# Patient Record
Sex: Male | Born: 1970 | Hispanic: No | Marital: Single | Smoking: Current every day smoker
Health system: Southern US, Community
[De-identification: ages and names within clinical notes are randomized; demographics above are authoritative.]

## PROBLEM LIST (undated history)

## (undated) DIAGNOSIS — B1910 Unspecified viral hepatitis B without hepatic coma: Secondary | ICD-10-CM

## (undated) DIAGNOSIS — A159 Respiratory tuberculosis unspecified: Secondary | ICD-10-CM

## (undated) DIAGNOSIS — K219 Gastro-esophageal reflux disease without esophagitis: Secondary | ICD-10-CM

---

## 2011-08-23 ENCOUNTER — Encounter: Payer: Self-pay | Admitting: Emergency Medicine

## 2011-08-23 ENCOUNTER — Emergency Department (HOSPITAL_COMMUNITY)
Admission: EM | Admit: 2011-08-23 | Discharge: 2011-08-23 | Disposition: A | Discharge status: Home / Self Care | Payer: Medicaid Other | Attending: Emergency Medicine | Admitting: Emergency Medicine

## 2011-08-23 DIAGNOSIS — L989 Disorder of the skin and subcutaneous tissue, unspecified: Secondary | ICD-10-CM | POA: Insufficient documentation

## 2011-08-23 DIAGNOSIS — L089 Local infection of the skin and subcutaneous tissue, unspecified: Secondary | ICD-10-CM

## 2011-08-23 MED ORDER — HYDROCODONE-ACETAMINOPHEN 7.5-325 MG/15ML PO SOLN: 15.0000 mL | Freq: Four times a day (QID) | ORAL | Status: DC | PRN

## 2011-08-23 MED ORDER — DOXYCYCLINE HYCLATE 100 MG PO CAPS: 100.0000 mg | ORAL_CAPSULE | Freq: Two times a day (BID) | ORAL | Status: AC

## 2011-08-23 NOTE — ED Provider Notes (Signed)
Medical screening examination/treatment/procedure(s) were performed by non-physician practitioner and as supervising physician I was immediately available for consultation/collaboration.  Flint Melter, MD 08/23/11 [phone]

## 2011-08-23 NOTE — ED Notes (Signed)
Pt states for 3 days has boil area to rt knee and lt hip area and rt fa, swelling and pain,

## 2011-08-23 NOTE — ED Notes (Signed)
Pt has multiple skin lesions and swollen tender areas on legs. 1+ pitting edema on r/calf

## 2011-08-23 NOTE — ED Provider Notes (Signed)
History     CSN: [id number] Arrival date & time: 08/23/2011  3:24 PM   First MD Initiated Contact with Patient 08/23/11 1640      Chief Complaint  Patient presents with  . Recurrent Skin Infections    HPI: Patient is a 40 y.o. male presenting with rash. The history is provided by the patient. The history is limited by a language barrier. A language interpreter was used.  Rash  This is a new problem. The current episode started more than 2 days ago. The problem has been gradually worsening. The problem is associated with nothing. There has been no fever. The rash is present on the right lower leg and left upper leg. The pain is at a severity of 4/10. The pain is mild. The pain has been constant since onset. Associated symptoms include pain. Pertinent negatives include no weeping.  Reports 3 day hx of painful swollen areas to his (R) knee, (R) shin and (L) latera lupper thigh.  History reviewed. No pertinent past medical history.  History reviewed. No pertinent past surgical history.  History reviewed. No pertinent family history.  History  Substance Use Topics  . Smoking status: Current Everyday Smoker  . Smokeless tobacco: Not on file  . Alcohol Use: Yes      Review of Systems  Constitutional: Negative.   HENT: Negative.   Eyes: Negative.   Respiratory: Negative.   Cardiovascular: Negative.   Gastrointestinal: Negative.   Genitourinary: Negative.   Musculoskeletal: Negative.   Skin: Positive for rash.  Neurological: Negative.   Hematological: Negative.   Psychiatric/Behavioral: Negative.     Allergies  Review of patient's allergies indicates no known allergies.  Home Medications  No current outpatient prescriptions on file.  There were no vitals taken for this visit.  Physical Exam  Constitutional: He is oriented to person, place, and time. He appears well-developed and well-nourished.  HENT:  Head: Normocephalic and atraumatic.  Eyes: Conjunctivae are  normal.  Cardiovascular: Normal rate.   Pulmonary/Chest: Effort normal.  Neurological: He is alert and oriented to person, place, and time.  Skin: Skin is warm and dry.          Slightly raised, erythematous, indurated lesions to (R) knee, (R) shin and (L) lateral upper thigh that area approx 2-3 cm in size w/o active drainage that are TTP.   Psychiatric: He has a normal mood and affect.    ED Course  ProceduresLabs Reviewed - No data to display No results found.    No diagnosis found.    MDM  Multiple painful, erythematous skin lesions worrisome for MRSA.       Leanne Chang, NP 08/23/11 1657  Roma Kayser Shaarav Ripple, NP 08/23/11 1659  Roma Kayser Amayiah Gosnell, NP 08/23/11 [id number]

## 2011-08-25 ENCOUNTER — Emergency Department (HOSPITAL_COMMUNITY)
Admission: EM | Admit: 2011-08-25 | Discharge: 2011-08-25 | Disposition: A | Discharge status: Home / Self Care | Payer: Medicaid Other | Source: Home / Self Care | Attending: Family Medicine | Admitting: Family Medicine

## 2011-08-25 ENCOUNTER — Encounter (HOSPITAL_COMMUNITY): Payer: Self-pay | Admitting: *Deleted

## 2011-08-25 DIAGNOSIS — L0390 Cellulitis, unspecified: Secondary | ICD-10-CM

## 2011-08-25 DIAGNOSIS — L0291 Cutaneous abscess, unspecified: Secondary | ICD-10-CM

## 2011-08-25 MED ORDER — SULFAMETHOXAZOLE-TRIMETHOPRIM 800-160 MG PO TABS: 1.0000 | ORAL_TABLET | Freq: Two times a day (BID) | ORAL | Status: AC

## 2011-08-25 NOTE — ED Notes (Signed)
Presents for f/u of skin infection; seen at [facility] ED & given doxycycline and hydrocodone susp on 11/23.  Reports no improvement, and c/o pitting edema to RLE.  Via [employer] # [id number], physician eval and instructions reviewed.  Pt verbalized understanding.

## 2011-08-25 NOTE — ED Provider Notes (Signed)
History     CSN: [id number] Arrival date & time: 08/25/2011 12:31 PM   First MD Initiated Contact with Patient 08/25/11 1115      Chief Complaint  Patient presents with  . Follow-up  . Leg Swelling    (Consider location/radiation/quality/duration/timing/severity/associated sxs/prior treatment) Patient is a 40 y.o. male presenting with rash. The history is provided by the patient. The history is limited by a language barrier.  Rash  This is a recurrent problem. The current episode started more than 2 days ago. The problem has not changed since onset.The problem is associated with an unknown (seen 11/23 in ER, given doxy and not improved,, here for re-eval.) factor. There has been no fever. The rash is present on the left buttock, face and right lower leg. The pain is at a severity of 4/10. The pain is mild. The pain has been constant since onset.    No past medical history on file.  No past surgical history on file.  No family history on file.  History  Substance Use Topics  . Smoking status: Current Everyday Smoker  . Smokeless tobacco: Not on file  . Alcohol Use: Yes      Review of Systems  Constitutional: Negative.   Skin: Positive for rash and wound.    Allergies  Review of patient's allergies indicates no known allergies.  Home Medications   Current Outpatient Rx  Name Route Sig Dispense Refill  . DOXYCYCLINE HYCLATE 100 MG PO CAPS Oral Take 1 capsule (100 mg total) by mouth 2 (two) times daily. 20 capsule 0  . HYDROCODONE-ACETAMINOPHEN 7.5-325 MG/15ML PO SOLN Oral Take 15 mLs by mouth 4 (four) times daily as needed for pain. 50 mL 0    BP 126/86  Pulse 84  Temp(Src) 98.8 F (37.1 C) (Oral)  Resp 17  SpO2 97%  Physical Exam  Constitutional: He appears well-developed and well-nourished.  HENT:  Head: Normocephalic.  Skin: Skin is warm and dry. Rash noted. Rash is pustular.       ED Course  Procedures (including critical care time)  Labs  Reviewed - No data to display No results found.   No diagnosis found.    MDM          Barkley Bruns, MD 08/25/11 1255

## 2011-08-29 ENCOUNTER — Encounter (HOSPITAL_COMMUNITY): Payer: Self-pay | Admitting: Emergency Medicine

## 2011-08-29 ENCOUNTER — Emergency Department (INDEPENDENT_AMBULATORY_CARE_PROVIDER_SITE_OTHER)
Admission: EM | Admit: 2011-08-29 | Discharge: 2011-08-29 | Disposition: A | Discharge status: Home / Self Care | Payer: Medicaid Other | Source: Home / Self Care | Attending: Family Medicine | Admitting: Family Medicine

## 2011-08-29 DIAGNOSIS — L0390 Cellulitis, unspecified: Secondary | ICD-10-CM

## 2011-08-29 NOTE — ED Notes (Signed)
Pt returns today for evaluation of right knee skin infection not improved by doxycycline hyclate and pain meds.pt was seen here 11/25 for same sx but treated @ [place] for infection.scabbed area noted with peeling of skin.no drainage or swelling seen.pt also reports of pimple that getting bigger to left side of nose

## 2011-08-29 NOTE — ED Provider Notes (Signed)
History     CSN: [id number] Arrival date & time: 08/29/2011 11:54 AM   First MD Initiated Contact with Patient 08/29/11 1029      Chief Complaint  Patient presents with  . Wound Check    (Consider location/radiation/quality/duration/timing/severity/associated sxs/prior treatment) HPI Comments: Harold Kelly presents for re-evaluation of several lesions on his body. He has been evaluated and treated in the ED and the [facility] for abscesses and skin infections. He reports improvement in the one on his RIGHT knee but persistent mild pain in the one on his posterior LEFT upper leg. 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   Patient is a 40 y.o. male presenting with wound check. The history is provided by the patient. The history is limited by a language barrier. No language interpreter was used.  Wound Check  He was treated in the ED today. Previous treatment in the ED includes oral antibiotics. Treatments since wound repair include oral antibiotics and a wound recheck. There has been no drainage from the wound. There is no redness present. There is no swelling present. The pain has improved.    History reviewed. No pertinent past medical history.  History reviewed. No pertinent past surgical history.  History reviewed. No pertinent family history.  History  Substance Use Topics  . Smoking status: Current Everyday Smoker  . Smokeless tobacco: Not on file  . Alcohol Use: Yes      Review of Systems  Constitutional: Negative.   HENT: Negative.   Eyes: Negative.   Respiratory: Negative.   Cardiovascular: Negative.   Gastrointestinal: Negative.   Genitourinary: Negative.   Musculoskeletal: Negative.   Skin: Positive for wound.       Multiple skin abscesses and infections, facial comedones  Neurological: Negative.     Allergies  Review of patient's allergies indicates no known allergies.  Home Medications   Current Outpatient Rx  Name Route Sig Dispense  Refill  . DOXYCYCLINE HYCLATE 100 MG PO CAPS Oral Take 1 capsule (100 mg total) by mouth 2 (two) times daily. 20 capsule 0  . SULFAMETHOXAZOLE-TRIMETHOPRIM 800-160 MG PO TABS Oral Take 1 tablet by mouth every 12 (twelve) hours. 20 tablet 0    BP 124/72  Pulse 64  Temp(Src) 98.7 F (37.1 C) (Oral)  Resp 16  SpO2 100%  Physical Exam  Constitutional: He is oriented to person, place, and time. He appears well-developed and well-nourished.  HENT:  Head: Normocephalic and atraumatic.  Eyes: EOM are normal.  Neck: Normal range of motion.  Pulmonary/Chest: Breath sounds normal.  Neurological: He is alert and oriented to person, place, and time.  Skin: Skin is warm and dry. Lesion and rash noted. Rash is pustular.       ED Course  INCISION AND DRAINAGE Date/Time: 08/29/2011 1:10 PM Performed by: Richardo Priest Authorized by: Richardo Priest Consent: Verbal consent obtained. Written consent not obtained. Risks and benefits: risks, benefits and alternatives were discussed Consent given by: patient Patient understanding: patient states understanding of the procedure being performed Patient identity confirmed: verbally with patient and arm band Type: abscess Body area: lower extremity Location details: left leg Anesthesia: local infiltration Local anesthetic: lidocaine 2% without epinephrine Anesthetic total: 3 ml Scalpel size: 11 Needle gauge: #27. Incision type: single straight Complexity: simple Drainage: purulent Drainage amount: scant Packing material: none Patient tolerance: Patient tolerated the procedure well with no immediate complications.   (including critical care time)  Labs Reviewed - No data to display No results  found.   No diagnosis found.    MDM  I&D abscess on posterior LEFT thigh        Richardo Priest, MD 08/29/11 1318

## 2011-10-10 ENCOUNTER — Ambulatory Visit
Admission: RE | Admit: 2011-10-10 | Discharge: 2011-10-10 | Disposition: A | Discharge status: Home / Self Care | Payer: No Typology Code available for payment source | Source: Ambulatory Visit | Attending: Infectious Diseases | Admitting: Infectious Diseases

## 2011-10-10 ENCOUNTER — Other Ambulatory Visit: Payer: Self-pay | Admitting: Infectious Diseases

## 2011-10-10 DIAGNOSIS — R7611 Nonspecific reaction to tuberculin skin test without active tuberculosis: Secondary | ICD-10-CM

## 2012-02-12 ENCOUNTER — Encounter (HOSPITAL_COMMUNITY): Payer: Self-pay | Admitting: *Deleted

## 2012-02-12 ENCOUNTER — Emergency Department (HOSPITAL_COMMUNITY)
Admission: EM | Admit: 2012-02-12 | Discharge: 2012-02-12 | Disposition: A | Discharge status: Home / Self Care | Payer: Self-pay | Attending: Emergency Medicine | Admitting: Emergency Medicine

## 2012-02-12 DIAGNOSIS — Z8619 Personal history of other infectious and parasitic diseases: Secondary | ICD-10-CM | POA: Insufficient documentation

## 2012-02-12 DIAGNOSIS — R102 Pelvic and perineal pain: Secondary | ICD-10-CM

## 2012-02-12 DIAGNOSIS — N509 Disorder of male genital organs, unspecified: Secondary | ICD-10-CM | POA: Insufficient documentation

## 2012-02-12 DIAGNOSIS — Z8611 Personal history of tuberculosis: Secondary | ICD-10-CM | POA: Insufficient documentation

## 2012-02-12 DIAGNOSIS — K6289 Other specified diseases of anus and rectum: Secondary | ICD-10-CM | POA: Insufficient documentation

## 2012-02-12 DIAGNOSIS — K219 Gastro-esophageal reflux disease without esophagitis: Secondary | ICD-10-CM | POA: Insufficient documentation

## 2012-02-12 DIAGNOSIS — Z79899 Other long term (current) drug therapy: Secondary | ICD-10-CM | POA: Insufficient documentation

## 2012-02-12 HISTORY — DX: Respiratory tuberculosis unspecified: A15.9

## 2012-02-12 HISTORY — DX: Unspecified viral hepatitis B without hepatic coma: B19.10

## 2012-02-12 HISTORY — DX: Gastro-esophageal reflux disease without esophagitis: K21.9

## 2012-02-12 LAB — URINALYSIS, ROUTINE W REFLEX MICROSCOPIC
Bilirubin Urine: NEGATIVE
Glucose, UA: NEGATIVE mg/dL
Hgb urine dipstick: NEGATIVE
Ketones, ur: NEGATIVE mg/dL
Leukocytes, UA: NEGATIVE
Nitrite: NEGATIVE
Protein, ur: NEGATIVE mg/dL
Specific Gravity, Urine: 1.02 (ref 1.005–1.030)
Urobilinogen, UA: 0.2 mg/dL (ref 0.0–1.0)
pH: 6.5 (ref 5.0–8.0)

## 2012-02-12 NOTE — ED Notes (Signed)
MD at bedside. 

## 2012-02-12 NOTE — Discharge Instructions (Signed)
It is not clear at this time what the reason for your pain is. I feel you area safe to go home but you need to follow-up with urology. Please see the referral information.

## 2012-02-12 NOTE — ED Notes (Signed)
Pt says he think he has hemmrohoids. Has been having rectal pain for several months, worse in the past couple of hours. Denies any bleeding.

## 2012-02-12 NOTE — ED Provider Notes (Signed)
History    40yM with perineal pain. Intermittent. Has at night then resolves during day. Has had off and on for several months. Currently no complaints. Denies hx of hemorrhoids. Hasn't felt any masses. No pain with defecation. No blood or mucus in stool or melena. No scrotal/testicular pain or swelling. NO penile discharge. No rash.    CSN: [id number]  Arrival date & time 02/12/12  3474   First MD Initiated Contact with Patient 02/12/12 [phone]      Chief Complaint  Patient presents with  . Rectal Pain    (Consider location/radiation/quality/duration/timing/severity/associated sxs/prior treatment) HPI  Past Medical History  Diagnosis Date  . Tuberculosis   . Hepatitis B   . GERD (gastroesophageal reflux disease)     History reviewed. No pertinent past surgical history.  No family history on file.  History  Substance Use Topics  . Smoking status: Current Everyday Smoker  . Smokeless tobacco: Not on file  . Alcohol Use: Yes      Review of Systems   Review of symptoms negative unless otherwise noted in HPI.   Allergies  Review of patient's allergies indicates no known allergies.  Home Medications   Current Outpatient Rx  Name Route Sig Dispense Refill  . RIFAMPIN 300 MG PO CAPS Oral Take 300 mg by mouth 2 (two) times daily.      BP 124/88  Pulse 88  Temp 98.8 F (37.1 C)  Resp 16  SpO2 100%  Physical Exam  Nursing note and vitals reviewed. Constitutional: He appears well-developed and well-nourished. No distress.  HENT:  Head: Normocephalic and atraumatic.  Eyes: Conjunctivae are normal. Right eye exhibits no discharge. Left eye exhibits no discharge.  Neck: Neck supple.  Cardiovascular: Normal rate, regular rhythm and normal heart sounds.  Exam reveals no gallop and no friction rub.   No murmur heard. Pulmonary/Chest: Effort normal and breath sounds normal. No respiratory distress.  Abdominal: Soft. He exhibits no distension and no mass. There is no  tenderness.  Genitourinary: Rectum normal, prostate normal and penis normal. No penile tenderness.       Circumcised. No scrotal edema. No lesions. No testicular tenderness. No penile discharge. No inguinal adenopathy. Perineum normal to inspection. No tenderness, induration or lesions noted. Rectal normal inspection. No massess. Good tone on DRE, no mass, no fluctuance, no tenderness, no blood or discharge noted.  Musculoskeletal: He exhibits no edema and no tenderness.  Neurological: He is alert.  Skin: Skin is warm and dry.  Psychiatric: He has a normal mood and affect. His behavior is normal. Thought content normal.    ED Course  Procedures (including critical care time)   Labs Reviewed  URINALYSIS, ROUTINE W REFLEX MICROSCOPIC   No results found.   1. Perineal pain in male       MDM  40yM with perineal pain. Points to area just below scrotum but grossly normal to inspection. No mass, tenderness or concerning lesions noted. GU and rectal exams unremarkable. No hemorrhoids. No pain with DRE. Consider pin worms with intermittent symptoms at night. Pt denies any itching though, says just hurts.  Etiology unclear to me at this time but low suspicion for emergent process. Will refer to urology for further eval.        Raeford Razor, MD 02/12/12 [phone]

## 2013-05-30 IMAGING — CR DG CHEST 1V
1 series · 1 of 1 positions shown · non-contrast
Comparison: None.

CLINICAL DATA: Positive PPD

CHEST - 1 VIEW

[view not recorded]
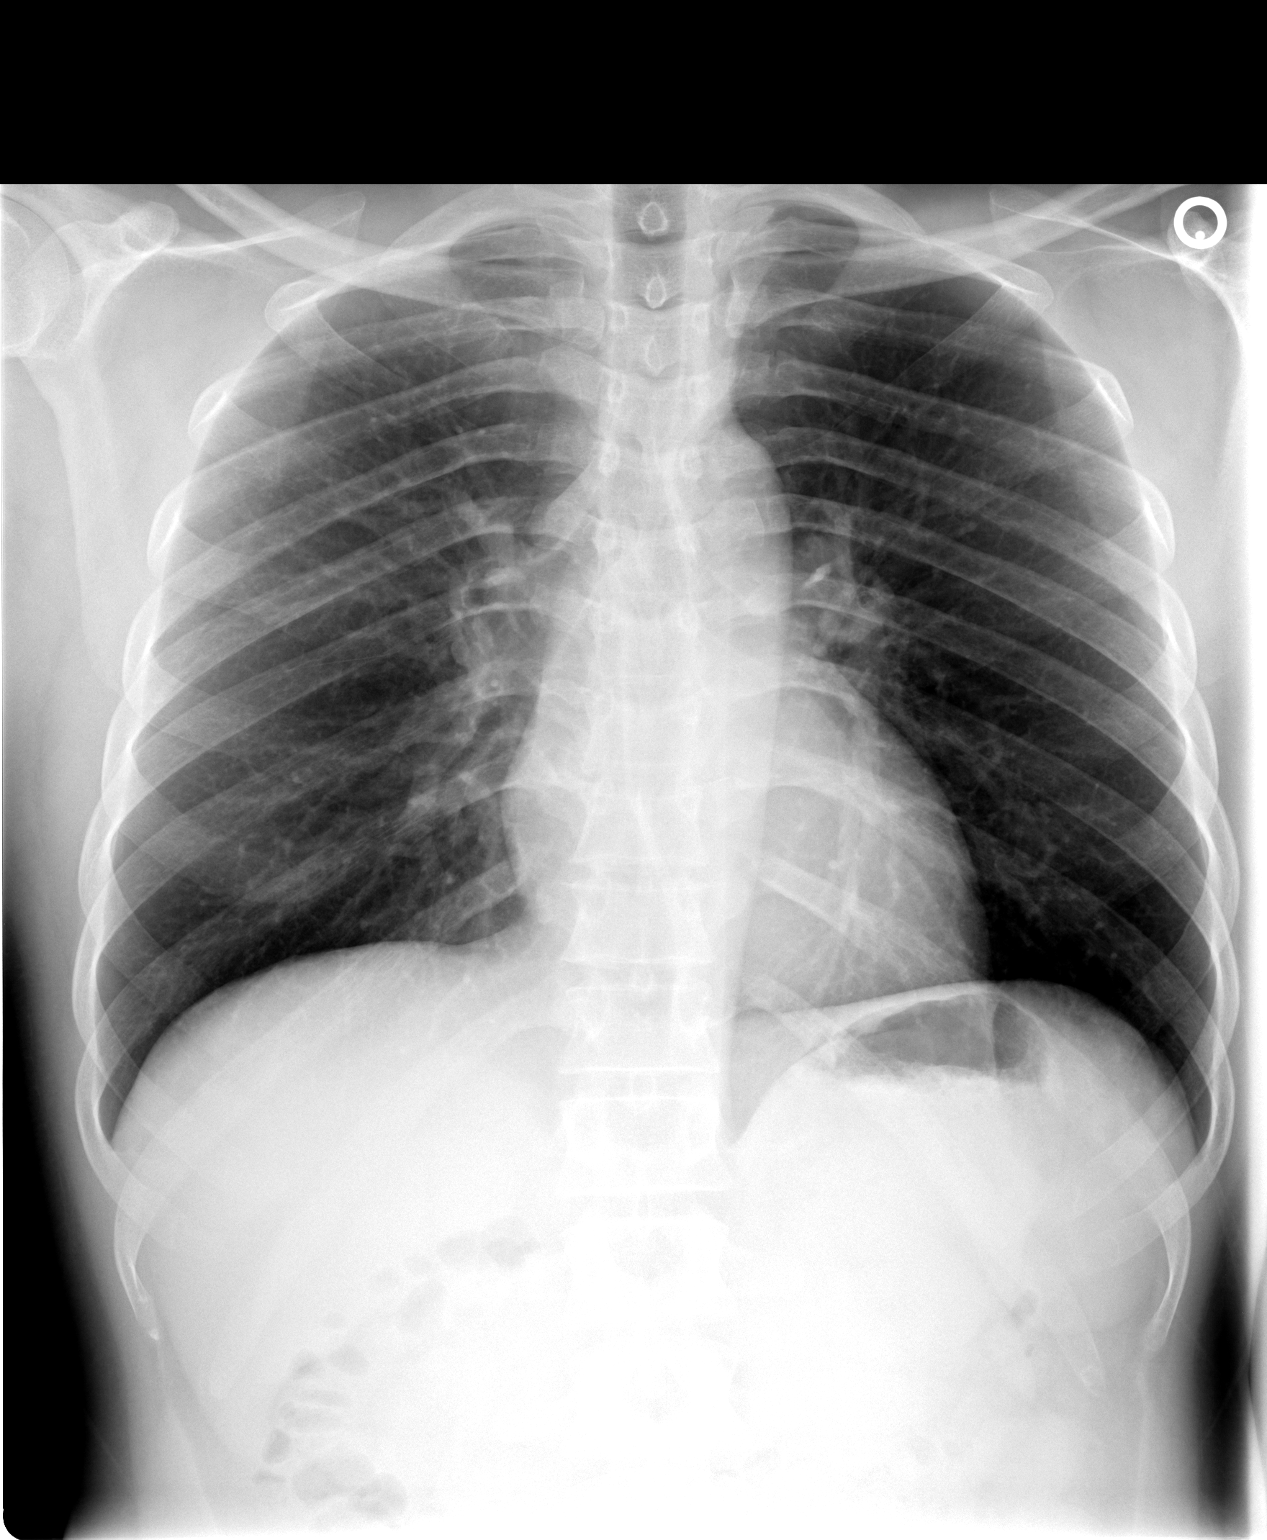

[1 of 1 positions shown; findings below may reference images not displayed]

FINDINGS: The lungs are clear.  Mediastinal contours appear normal.
The heart is within normal limits in size.  No bony abnormality is
seen.
IMPRESSION: No active lung disease.

## 2015-07-04 ENCOUNTER — Ambulatory Visit: Payer: Medicaid Other | Admitting: Podiatry

## 2015-07-20 ENCOUNTER — Ambulatory Visit: Payer: Self-pay

## 2015-07-20 ENCOUNTER — Encounter: Payer: BLUE CROSS/BLUE SHIELD | Admitting: Podiatry

## 2015-07-25 NOTE — Progress Notes (Signed)
This encounter was created in error - please disregard.

## 2015-08-07 ENCOUNTER — Ambulatory Visit (INDEPENDENT_AMBULATORY_CARE_PROVIDER_SITE_OTHER): Payer: BLUE CROSS/BLUE SHIELD

## 2015-08-07 ENCOUNTER — Ambulatory Visit (INDEPENDENT_AMBULATORY_CARE_PROVIDER_SITE_OTHER): Payer: BLUE CROSS/BLUE SHIELD | Admitting: Podiatry

## 2015-08-07 ENCOUNTER — Encounter: Payer: Self-pay | Admitting: Podiatry

## 2015-08-07 VITALS — BP 137/91 | HR 61 | Resp 16 | Ht 64.0 in | Wt 129.0 lb

## 2015-08-07 DIAGNOSIS — L309 Dermatitis, unspecified: Secondary | ICD-10-CM | POA: Diagnosis not present

## 2015-08-07 DIAGNOSIS — M779 Enthesopathy, unspecified: Secondary | ICD-10-CM

## 2015-08-07 DIAGNOSIS — M79672 Pain in left foot: Secondary | ICD-10-CM

## 2015-08-07 NOTE — Progress Notes (Signed)
   Subjective:    Patient ID: Harold Kelly, male    DOB: Feb 01, 1971, 44 y.o.   MRN: [id number]  HPI Patient presents with foot pain in their left foot; ball of foot; base of 2nd toe radiating down to ball of foot; for one and half months; skin discoloration on ball of foot for the past 2-3 months; cream pt is using is helping with this. Started off looking black but better now.   Pt diabetic Type I; sugar=did not take this morning; pt did not know his A1C levels.   Review of Systems  Eyes: Positive for redness.  Allergic/Immunologic: Positive for food allergies.  All other systems reviewed and are negative.      Objective:   Physical Exam        Assessment & Plan:

## 2015-08-09 NOTE — Progress Notes (Signed)
Subjective:     Patient ID: Harold Kelly, male   DOB: Jan 09, 1971, 44 y.o.   MRN: [id number]  HPI patient presents with discoloration bottom of his left foot but currently does not have any drainage that seemed indicate he may have had some in the past   Review of Systems  All other systems reviewed and are negative.      Objective:   Physical Exam  Constitutional: He is oriented to person, place, and time.  Cardiovascular: Intact distal pulses.   Musculoskeletal: Normal range of motion.  Neurological: He is oriented to person, place, and time.  Skin: Skin is warm and dry.  Nursing note and vitals reviewed.  neurovascular status was found to be intact muscle strength was adequate and perfusion was noted to be normal to the digits. Patient has thickness of the plantar tissue left distal one half of the foot plantar and I noted there to be no drainage or fissures and I did not note any cracks between the interdigital areas.     Assessment:     Appears to be more dermatitis in its action and patient currently is using a steroid fungal cream    Plan:     H&P diabetic education rendered and instructed on daily foot inspections of any drainage were to occur to let us know immediately. Patient will be seen back and continue with this seemed treatment plan
